# Patient Record
Sex: Male | Born: 1980 | Race: White | Hispanic: No | Marital: Single | Smoking: Current every day smoker
Health system: Southern US, Community
[De-identification: ages and names within clinical notes are randomized; demographics above are authoritative.]

## PROBLEM LIST (undated history)

## (undated) DIAGNOSIS — I10 Essential (primary) hypertension: Secondary | ICD-10-CM

## (undated) DIAGNOSIS — M549 Dorsalgia, unspecified: Secondary | ICD-10-CM

---

## 2015-03-24 ENCOUNTER — Encounter (HOSPITAL_BASED_OUTPATIENT_CLINIC_OR_DEPARTMENT_OTHER): Payer: Self-pay

## 2015-03-24 ENCOUNTER — Emergency Department (HOSPITAL_BASED_OUTPATIENT_CLINIC_OR_DEPARTMENT_OTHER)
Admission: EM | Admit: 2015-03-24 | Discharge: 2015-03-24 | Disposition: A | Discharge status: Home / Self Care | Payer: Medicare Other | Attending: Emergency Medicine | Admitting: Emergency Medicine

## 2015-03-24 DIAGNOSIS — G8929 Other chronic pain: Secondary | ICD-10-CM | POA: Diagnosis not present

## 2015-03-24 DIAGNOSIS — Z87828 Personal history of other (healed) physical injury and trauma: Secondary | ICD-10-CM | POA: Diagnosis not present

## 2015-03-24 DIAGNOSIS — M549 Dorsalgia, unspecified: Secondary | ICD-10-CM | POA: Insufficient documentation

## 2015-03-24 DIAGNOSIS — I10 Essential (primary) hypertension: Secondary | ICD-10-CM | POA: Insufficient documentation

## 2015-03-24 DIAGNOSIS — Z720 Tobacco use: Secondary | ICD-10-CM | POA: Insufficient documentation

## 2015-03-24 HISTORY — DX: Essential (primary) hypertension: I10

## 2015-03-24 HISTORY — DX: Dorsalgia, unspecified: M54.9

## 2015-03-24 MED ORDER — LISINOPRIL 20 MG PO TABS: 20.0000 mg | ORAL_TABLET | Freq: Every day | ORAL | Status: DC

## 2015-03-24 MED ORDER — ACETAMINOPHEN 500 MG PO TABS: 500.0000 mg | ORAL_TABLET | Freq: Four times a day (QID) | ORAL | Status: AC | PRN

## 2015-03-24 MED ORDER — KETOROLAC TROMETHAMINE 30 MG/ML IJ SOLN
30.0000 mg | Freq: Once | INTRAMUSCULAR | Status: AC
  Administered 2015-03-24: 30 mg via INTRAMUSCULAR
  Filled 2015-03-24: qty 1
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
## 2015-03-24 NOTE — ED Notes (Signed)
Pt was diagnosed with a bulging disc L5-S1 in 2009, was doing yard work 2 days ago when pain flared up. Visiting from [place] and forgot pain meds at home. Pain 8/10. Pain radiated down R. Leg yesterday

## 2015-03-24 NOTE — Discharge Instructions (Signed)
Back Pain, Adult °Low back pain is very common. About 1 in 5 people have back pain. The cause of low back pain is rarely dangerous. The pain often gets better over time. About half of people with a sudden onset of back pain feel better in just 2 weeks. About 8 in 10 people feel better by 6 weeks.  °CAUSES °Some common causes of back pain include: °· Strain of the muscles or ligaments supporting the spine. °· Wear and tear (degeneration) of the spinal discs. °· Arthritis. °· Direct injury to the back. °DIAGNOSIS °Most of the time, the direct cause of low back pain is not known. However, back pain can be treated effectively even when the exact cause of the pain is unknown. Answering your caregiver's questions about your overall health and symptoms is one of the most accurate ways to make sure the cause of your pain is not dangerous. If your caregiver needs more information, he or she may order lab work or imaging tests (X-rays or MRIs). However, even if imaging tests show changes in your back, this usually does not require surgery. °HOME CARE INSTRUCTIONS °For many people, back pain returns. Since low back pain is rarely dangerous, it is often a condition that people can learn to manage on their own.  °· Remain active. It is stressful on the back to sit or stand in one place. Do not sit, drive, or stand in one place for more than 30 minutes at a time. Take short walks on level surfaces as soon as pain allows. Try to increase the length of time you walk each day. °· Do not stay in bed. Resting more than 1 or 2 days can delay your recovery. °· Do not avoid exercise or work. Your body is made to move. It is not dangerous to be active, even though your back may hurt. Your back will likely heal faster if you return to being active before your pain is gone. °· Pay attention to your body when you  bend and lift. Many people have less discomfort when lifting if they bend their knees, keep the load close to their bodies, and  avoid twisting. Often, the most comfortable positions are those that put less stress on your recovering back. °· Find a comfortable position to sleep. Use a firm mattress and lie on your side with your knees slightly bent. If you lie on your back, put a pillow under your knees. °· Only take over-the-counter or prescription medicines as directed by your caregiver. Over-the-counter medicines to reduce pain and inflammation are often the most helpful. Your caregiver may prescribe muscle relaxant drugs. These medicines help dull your pain so you can more quickly return to your normal activities and healthy exercise. °· Put ice on the injured area. °¨ Put ice in a plastic bag. °¨ Place a towel between your skin and the bag. °¨ Leave the ice on for 15-20 minutes, 03-04 times a day for the first 2 to 3 days. After that, ice and heat may be alternated to reduce pain and spasms. °· Ask your caregiver about trying back exercises and gentle massage. This may be of some benefit. °· Avoid feeling anxious or stressed. Stress increases muscle tension and can worsen back pain. It is important to recognize when you are anxious or stressed and learn ways to manage it. Exercise is a great option. °SEEK MEDICAL CARE IF: °· You have pain that is not relieved with rest or medicine. °· You have pain that does not improve in 1 week. °· You have new symptoms. °· You are generally not feeling well. °SEEK   IMMEDIATE MEDICAL CARE IF:   You have pain that radiates from your back into your legs.  You develop new bowel or bladder control problems.  You have unusual weakness or numbness in your arms or legs.  You develop nausea or vomiting.  You develop abdominal pain.  You feel faint. Document Released: 07/03/2005 Document Revised: 01/02/2012 Document Reviewed: 11/04/2013 [facility] Patient Information 2015 [place], [place]. This information is not intended to replace advice given to you by your health care provider. Make sure you  discuss any questions you have with your health care provider.  Will not give pain medication as it will violate the patient's pain contract. Recommend the patient contacts a management physician in [place] for pain management options while on vacation.

## 2015-03-24 NOTE — ED Notes (Addendum)
Lower back pain-with hx of same-denies recent injury-left his pain meds and BP meds at home in [facility]

## 2017-07-10 ENCOUNTER — Other Ambulatory Visit: Payer: Self-pay

## 2017-07-10 ENCOUNTER — Emergency Department (HOSPITAL_COMMUNITY): Payer: Medicare Other

## 2017-07-10 ENCOUNTER — Encounter (HOSPITAL_COMMUNITY): Payer: Self-pay | Admitting: Emergency Medicine

## 2017-07-10 ENCOUNTER — Emergency Department (HOSPITAL_COMMUNITY)
Admission: EM | Admit: 2017-07-10 | Discharge: 2017-07-11 | Disposition: A | Discharge status: Home / Self Care | Payer: Medicare Other | Attending: Emergency Medicine | Admitting: Emergency Medicine

## 2017-07-10 DIAGNOSIS — W2209XA Striking against other stationary object, initial encounter: Secondary | ICD-10-CM | POA: Diagnosis not present

## 2017-07-10 DIAGNOSIS — Z23 Encounter for immunization: Secondary | ICD-10-CM | POA: Diagnosis not present

## 2017-07-10 DIAGNOSIS — Y998 Other external cause status: Secondary | ICD-10-CM | POA: Insufficient documentation

## 2017-07-10 DIAGNOSIS — R202 Paresthesia of skin: Secondary | ICD-10-CM | POA: Diagnosis not present

## 2017-07-10 DIAGNOSIS — F17200 Nicotine dependence, unspecified, uncomplicated: Secondary | ICD-10-CM | POA: Diagnosis not present

## 2017-07-10 DIAGNOSIS — I10 Essential (primary) hypertension: Secondary | ICD-10-CM | POA: Diagnosis not present

## 2017-07-10 DIAGNOSIS — S60221A Contusion of right hand, initial encounter: Secondary | ICD-10-CM | POA: Diagnosis not present

## 2017-07-10 DIAGNOSIS — Y929 Unspecified place or not applicable: Secondary | ICD-10-CM | POA: Insufficient documentation

## 2017-07-10 DIAGNOSIS — R000 Tachycardia, unspecified: Secondary | ICD-10-CM | POA: Insufficient documentation

## 2017-07-10 DIAGNOSIS — Z79899 Other long term (current) drug therapy: Secondary | ICD-10-CM | POA: Insufficient documentation

## 2017-07-10 DIAGNOSIS — Y9389 Activity, other specified: Secondary | ICD-10-CM | POA: Diagnosis not present

## 2017-07-10 DIAGNOSIS — S6991XA Unspecified injury of right wrist, hand and finger(s), initial encounter: Secondary | ICD-10-CM | POA: Diagnosis present

## 2017-07-10 LAB — I-STAT TROPONIN, ED: Troponin i, poc: 0 ng/mL (ref 0.00–0.08)

## 2017-07-10 MED ORDER — TETANUS-DIPHTH-ACELL PERTUSSIS 5-2.5-18.5 LF-MCG/0.5 IM SUSP
0.5000 mL | Freq: Once | INTRAMUSCULAR | Status: AC
  Administered 2017-07-11: 0.5 mL via INTRAMUSCULAR
  Filled 2017-07-10: qty 0.5

## 2017-07-10 MED ORDER — BACITRACIN ZINC 500 UNIT/GM EX OINT
1.0000 "application " | TOPICAL_OINTMENT | Freq: Two times a day (BID) | CUTANEOUS | Status: DC
  Administered 2017-07-11: 1 via TOPICAL
  Filled 2017-07-10: qty 0.9

## 2017-07-10 MED ORDER — ACETAMINOPHEN 500 MG PO TABS
1000.0000 mg | ORAL_TABLET | Freq: Once | ORAL | Status: AC
  Administered 2017-07-11: 1000 mg via ORAL
  Filled 2017-07-10: qty 2
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
## 2017-07-11 LAB — BASIC METABOLIC PANEL
ANION GAP: 10 (ref 5–15)
BUN: 10 mg/dL (ref 6–20)
CO2: 22 mmol/L (ref 22–32)
Calcium: 9.1 mg/dL (ref 8.9–10.3)
Chloride: 104 mmol/L (ref 101–111)
Creatinine, Ser: 1.04 mg/dL (ref 0.61–1.24)
Glucose, Bld: 92 mg/dL (ref 65–99)
POTASSIUM: 3.6 mmol/L (ref 3.5–5.1)
SODIUM: 136 mmol/L (ref 135–145)

## 2017-07-11 LAB — CBC
HEMATOCRIT: 41.3 % (ref 39.0–52.0)
HEMOGLOBIN: 14 g/dL (ref 13.0–17.0)
MCH: 29.1 pg (ref 26.0–34.0)
MCHC: 33.9 g/dL (ref 30.0–36.0)
MCV: 85.9 fL (ref 78.0–100.0)
Platelets: 312 10*3/uL (ref 150–400)
RBC: 4.81 MIL/uL (ref 4.22–5.81)
RDW: 13.4 % (ref 11.5–15.5)
WBC: 12.3 10*3/uL — AB (ref 4.0–10.5)

## 2017-07-11 NOTE — ED Notes (Signed)
Pt verbalizes understanding of d/c instructions. Pt ambulatory at d/c with all belongings.   
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
## 2017-07-11 NOTE — ED Notes (Signed)
Bacitracin applied to wounds on rt knuckles. Band-Aids applied.
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
